# Patient Record
Sex: Female | Born: 1960 | Race: Black or African American | Hispanic: No
Health system: Midwestern US, Academic
[De-identification: ages and names within clinical notes are randomized; demographics above are authoritative.]

---

## 2018-08-09 ENCOUNTER — Emergency Department: Admit: 2018-08-09 | Discharge: 2018-08-09 | Payer: MEDICARE

## 2018-08-09 ENCOUNTER — Encounter: Admit: 2018-08-09 | Discharge: 2018-08-09 | Payer: MEDICARE | Primary: Sports Medicine

## 2018-08-09 ENCOUNTER — Emergency Department: Admit: 2018-08-09 | Discharge: 2018-08-10 | Disposition: A | Discharge status: Home / Self Care | Payer: MEDICARE

## 2018-08-09 DIAGNOSIS — R51 Headache: Secondary | ICD-10-CM

## 2018-08-09 DIAGNOSIS — R569 Unspecified convulsions: Principal | ICD-10-CM

## 2018-08-09 DIAGNOSIS — E119 Type 2 diabetes mellitus without complications: ICD-10-CM

## 2018-08-09 DIAGNOSIS — B20 Human immunodeficiency virus [HIV] disease: ICD-10-CM

## 2018-08-09 DIAGNOSIS — R079 Chest pain, unspecified: Secondary | ICD-10-CM

## 2018-08-09 MED ORDER — ACETAMINOPHEN 325 MG PO TAB
650 mg | Freq: Once | ORAL | 0 refills | Status: CP
Start: 2018-08-09 — End: ?
  Administered 2018-08-10: 03:00:00 650 mg via ORAL

## 2018-08-09 MED ORDER — SODIUM CHLORIDE 0.9 % IV SOLP
1000 mL | Freq: Once | INTRAVENOUS | 0 refills | Status: CP
Start: 2018-08-09 — End: ?
  Administered 2018-08-10: 08:00:00 1000 mL via INTRAVENOUS

## 2018-08-09 MED ORDER — SODIUM CHLORIDE 0.9 % IV SOLP
1000 mL | Freq: Once | INTRAVENOUS | 0 refills | Status: CP
Start: 2018-08-09 — End: ?
  Administered 2018-08-10: 04:00:00 1000 mL via INTRAVENOUS

## 2018-08-09 MED ORDER — DIPHENHYDRAMINE HCL 50 MG/ML IJ SOLN
25 mg | Freq: Once | INTRAVENOUS | 0 refills | Status: CP
Start: 2018-08-09 — End: ?
  Administered 2018-08-10: 04:00:00 25 mg via INTRAVENOUS

## 2018-08-09 MED ORDER — MAGNESIUM SULFATE IN D5W 1 GRAM/100 ML IV PGBK
1 g | Freq: Once | INTRAVENOUS | 0 refills | Status: CP
Start: 2018-08-09 — End: ?
  Administered 2018-08-10: 08:00:00 1 g via INTRAVENOUS

## 2018-08-09 MED ORDER — METOCLOPRAMIDE HCL 5 MG/ML IJ SOLN
10 mg | Freq: Once | INTRAVENOUS | 0 refills | Status: CP
Start: 2018-08-09 — End: ?
  Administered 2018-08-10: 04:00:00 10 mg via INTRAVENOUS

## 2018-08-10 ENCOUNTER — Emergency Department: Admit: 2018-08-09 | Discharge: 2018-08-09 | Payer: MEDICARE

## 2018-08-10 ENCOUNTER — Emergency Department: Admit: 2018-08-10 | Discharge: 2018-08-10 | Payer: MEDICARE

## 2018-08-10 ENCOUNTER — Encounter: Admit: 2018-08-10 | Discharge: 2018-08-10 | Payer: MEDICARE | Primary: Sports Medicine

## 2018-08-10 DIAGNOSIS — R202 Paresthesia of skin: ICD-10-CM

## 2018-08-10 DIAGNOSIS — R700 Elevated erythrocyte sedimentation rate: ICD-10-CM

## 2018-08-10 DIAGNOSIS — R51 Headache: ICD-10-CM

## 2018-08-10 DIAGNOSIS — R079 Chest pain, unspecified: ICD-10-CM

## 2018-08-10 DIAGNOSIS — G43009 Migraine without aura, not intractable, without status migrainosus: Principal | ICD-10-CM

## 2018-08-10 LAB — MAGNESIUM: Lab: 2.1 mg/dL (ref 1.6–2.6)

## 2018-08-10 LAB — CD4 ABSOLUTE CELL COUNT: Lab: 135 MMOL/L (ref 440–2160)

## 2018-08-10 LAB — URINALYSIS DIPSTICK
Lab: NEGATIVE
Lab: NEGATIVE 10*3/uL (ref 3–12)
Lab: NEGATIVE U/L (ref 7–56)
Lab: NEGATIVE mL/min — ABNORMAL LOW (ref 1.0–4.8)

## 2018-08-10 LAB — COMPREHENSIVE METABOLIC PANEL
Lab: 137 MMOL/L (ref 137–147)
Lab: 4.2 MMOL/L (ref 3.5–5.1)
Lab: 9.3 mg/dL (ref 8.5–10.6)

## 2018-08-10 LAB — URINALYSIS, MICROSCOPIC

## 2018-08-10 LAB — C REACTIVE PROTEIN (CRP): Lab: 1.8 mg/dL — ABNORMAL HIGH (ref <1.0)

## 2018-08-10 LAB — SED RATE: Lab: 45 mm/h — ABNORMAL HIGH (ref <1.60)

## 2018-08-10 LAB — POC CREATININE, RAD: Lab: 1.1 mg/dL — ABNORMAL HIGH (ref 0.4–1.00)

## 2018-08-10 LAB — POC TROPONIN

## 2018-08-10 LAB — CBC AND DIFF: Lab: 9 10*3/uL (ref 4.5–11.0)

## 2018-08-10 MED ORDER — VALPROIC ACID IVPB
500 mg | Freq: Once | INTRAVENOUS | 0 refills | Status: CP
Start: 2018-08-10 — End: ?
  Administered 2018-08-10 (×2): 500 mg via INTRAVENOUS

## 2018-08-11 LAB — CULTURE-URINE W/SENSITIVITY: Lab: 3

## 2018-08-12 LAB — HIV VIRAL LOAD PCR QUANT

## 2018-08-28 ENCOUNTER — Encounter: Admit: 2018-08-28 | Discharge: 2018-08-28 | Payer: MEDICARE | Primary: Sports Medicine

## 2018-08-28 DIAGNOSIS — B20 Human immunodeficiency virus [HIV] disease: ICD-10-CM

## 2018-08-28 DIAGNOSIS — R569 Unspecified convulsions: Principal | ICD-10-CM

## 2018-08-28 DIAGNOSIS — E119 Type 2 diabetes mellitus without complications: ICD-10-CM

## 2019-10-20 IMAGING — CT Head^_WITHOUT_CONTRAST (Adult)
1 series · 16 of 30 positions shown, 20 images · non-contrast
Comparison: none

[Series 2: brain w/o 4.8 brain · axial · non-contrast · 0.49mm/px · z∈[+112,+252]mm · 16 of 33 slices shown, 20 images]
[im 2/33  brain]
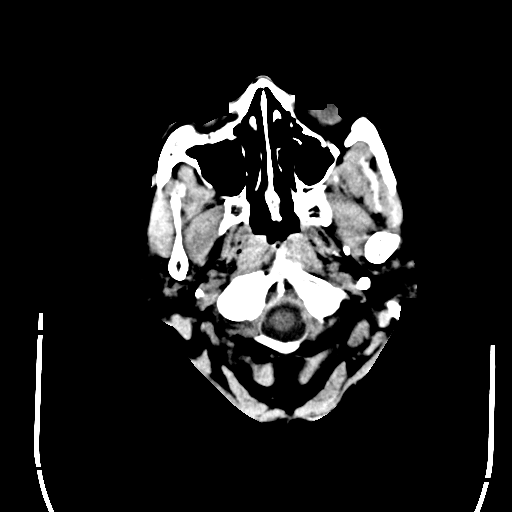
[im 2/33  bone]
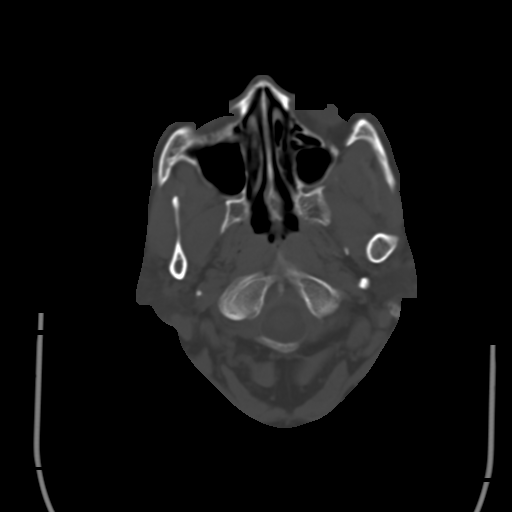
[im 4/33  brain]
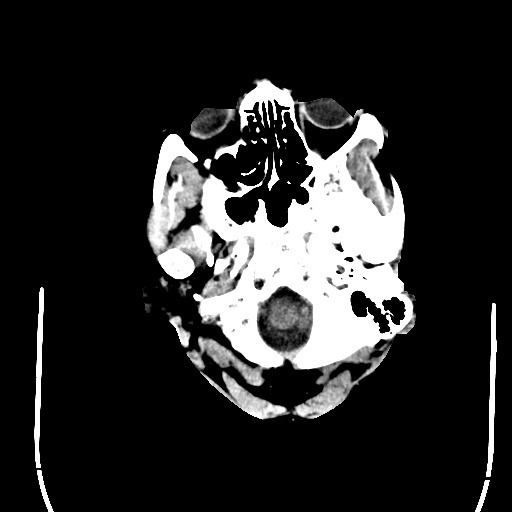
[im 6/33  brain]
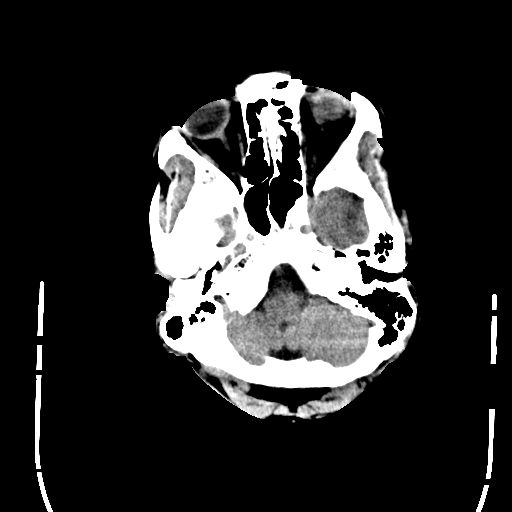
[im 8/33  brain]
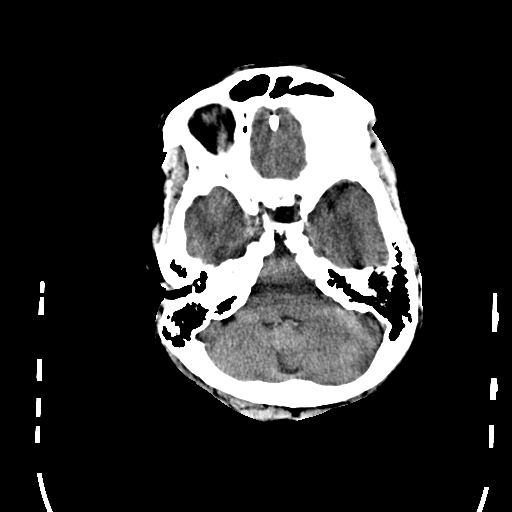
[im 9/33  brain]
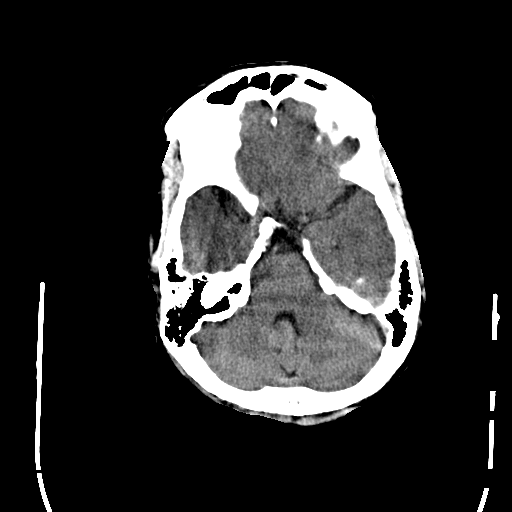
[im 9/33  bone]
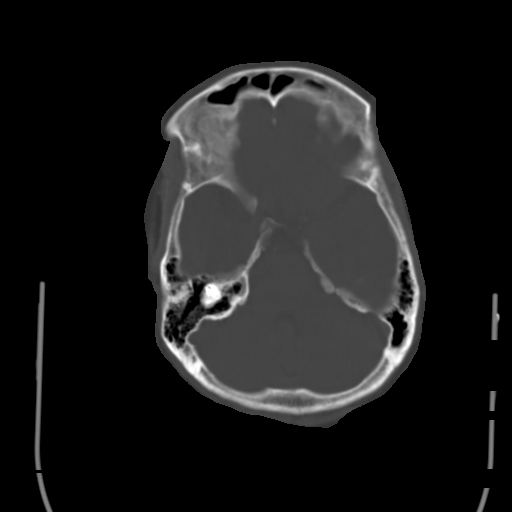
[im 12/33  brain]
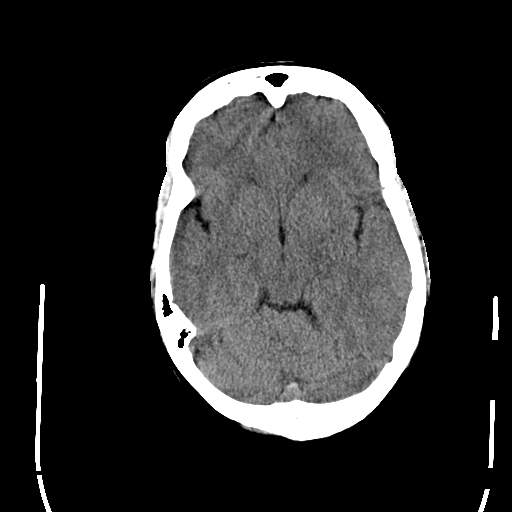
[im 14/33  brain]
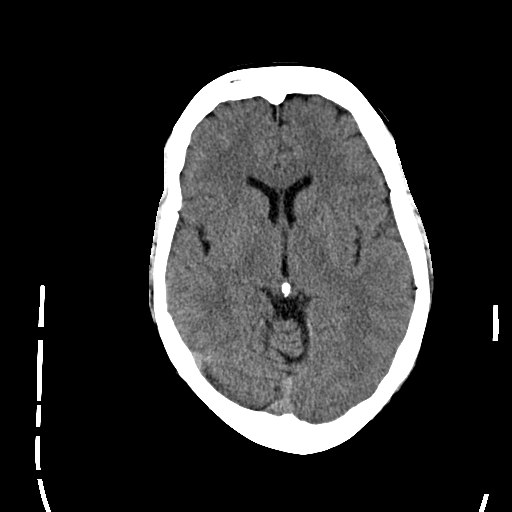
[im 16/33  brain]
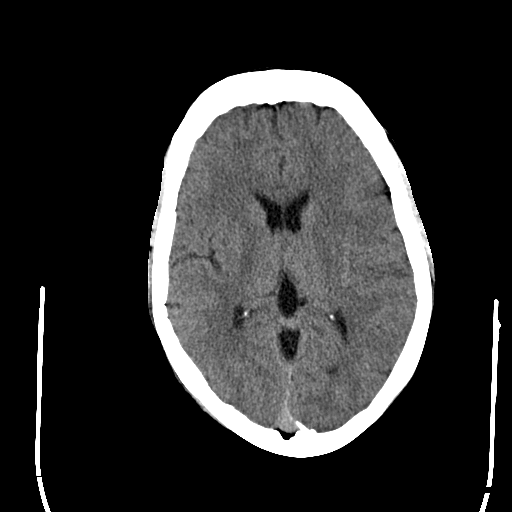
[im 17/33  brain]
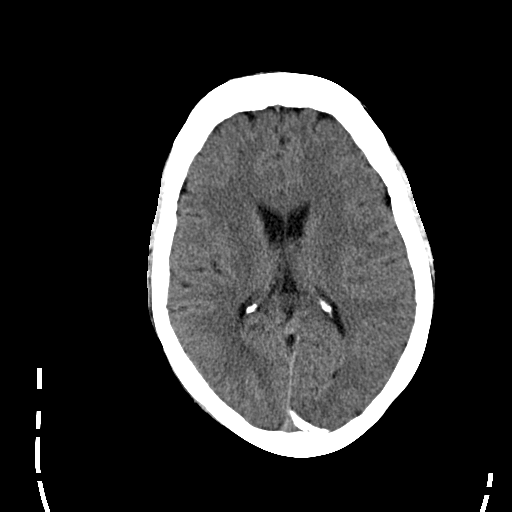
[im 17/33  bone]
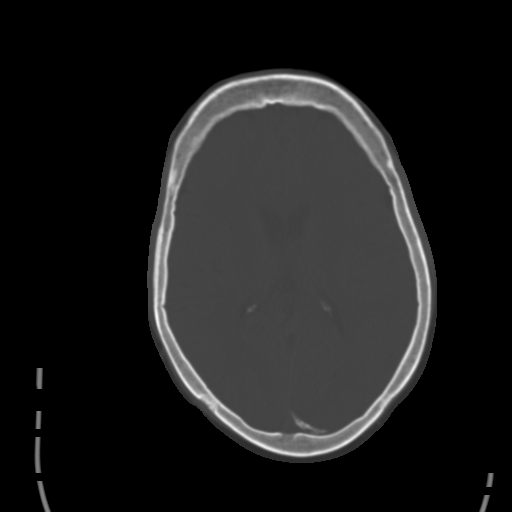
[im 19/33  brain]
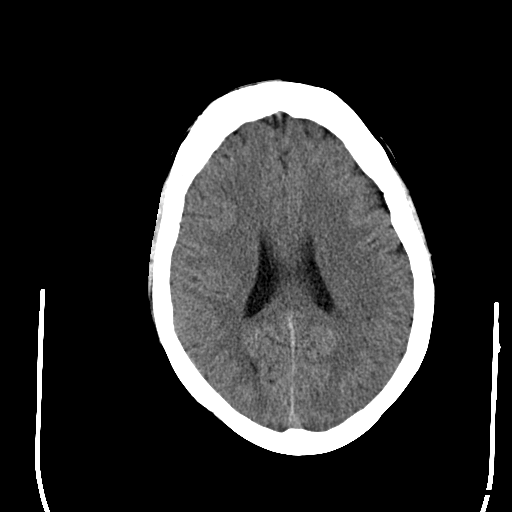
[im 21/33  brain]
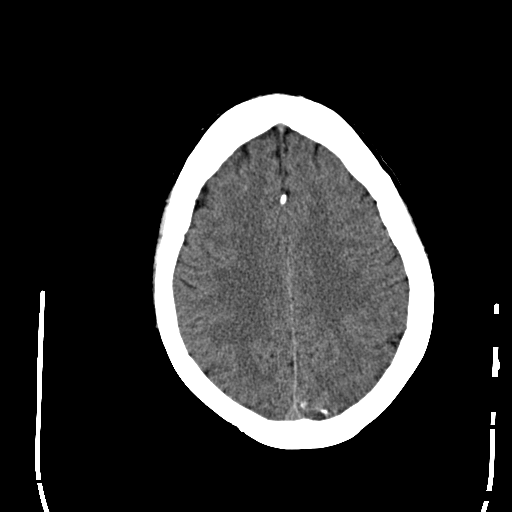
[im 24/33  brain]
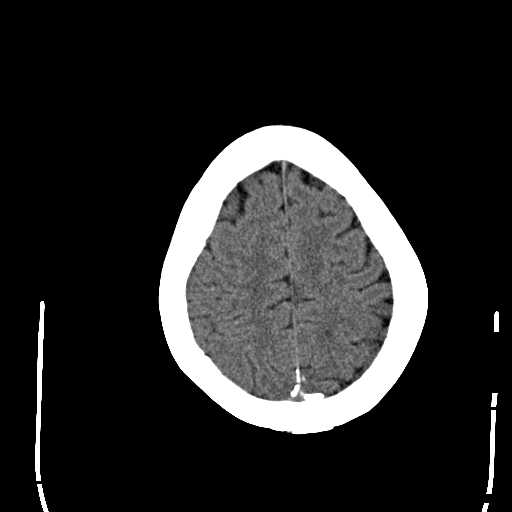
[im 25/33  brain]
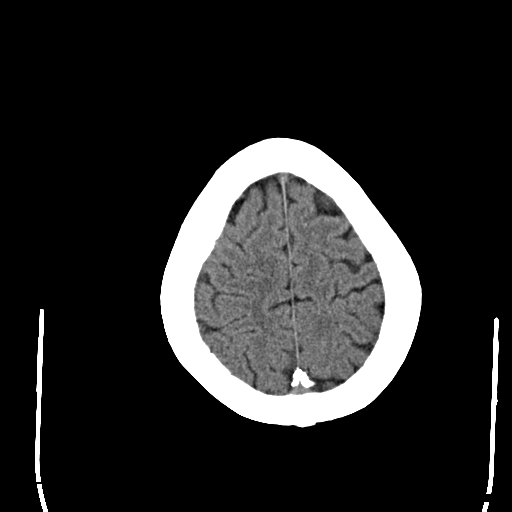
[im 25/33  bone]
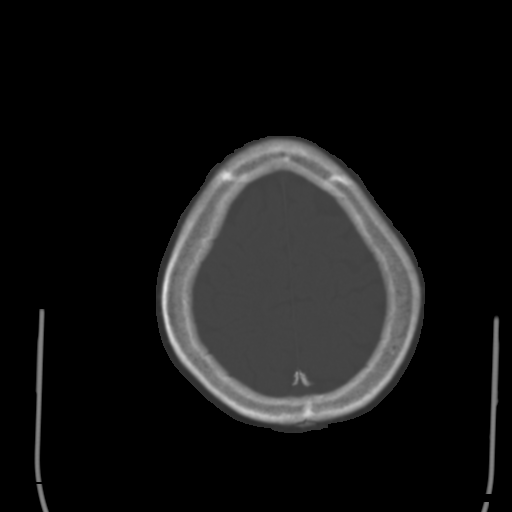
[im 27/33  brain]
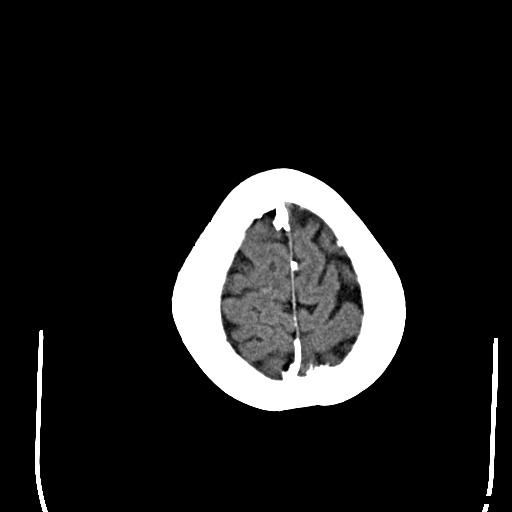
[im 29/33  brain]
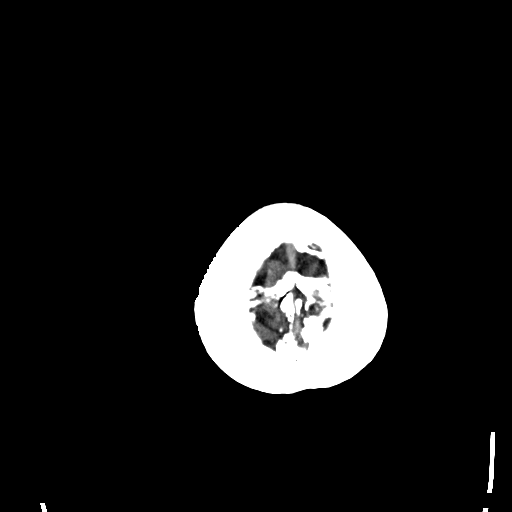
[im 31/33  brain]
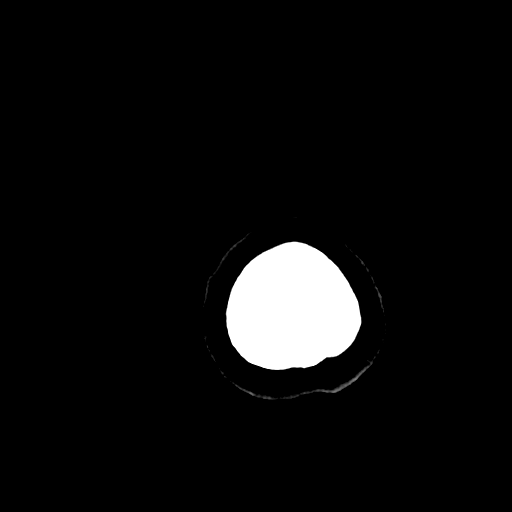

[16 of 30 positions shown; findings below may reference images not displayed]

EXAM
COMPUTED TOMOGRAPHY,HEAD OR BRAIN; WITHOUT CONTRAST MATERIAL; CPT 87327

INDICATION
headache HIV +
headache x3 days - AK/ME

TECHNIQUE
Noncontrast head CT was performed. All CT scans at this facility use dose modulation, iterative
reconstruction, and/or weight based dosing when appropriate to reduce radiation dose to as low as
reasonably achievable.

COMPARISONS
No prior studies are available for comparison.

FINDINGS
No evidence for acute infarct, mass, hemorrhage, or midline shift. No extra-axial fluid
collections. Gray/white matter differentiation is preserved. Ventricles appear normal.
Calcifications are appreciated throughout the dura and tracking along the falx.

Review of bone and soft tissue windows is unremarkable.

IMPRESSION
No acute intracranial abnormality.

Tech Notes:

headache x3 days - AK/ME

## 2020-02-21 ENCOUNTER — Emergency Department: Admit: 2020-02-21 | Discharge: 2020-02-21 | Payer: MEDICARE

## 2020-02-21 ENCOUNTER — Encounter: Admit: 2020-02-21 | Discharge: 2020-02-21 | Payer: MEDICARE | Primary: Sports Medicine

## 2020-02-21 DIAGNOSIS — R079 Chest pain, unspecified: Secondary | ICD-10-CM

## 2020-02-21 LAB — COMPREHENSIVE METABOLIC PANEL
Lab: 1 mg/dL — ABNORMAL HIGH (ref 0.4–1.00)
Lab: 1.4 mg/dL — ABNORMAL HIGH (ref 0.3–1.2)
Lab: 11 U/L (ref 7–56)
Lab: 139 MMOL/L (ref 137–147)
Lab: 14 U/L (ref 7–40)
Lab: 147 U/L — ABNORMAL HIGH (ref 25–110)
Lab: 16 mg/dL (ref 7–25)
Lab: 30 MMOL/L (ref 21–30)
Lab: 4.3 g/dL (ref 3.5–5.0)
Lab: 7 K/UL (ref 3–12)
Lab: 7.8 g/dL (ref 6.0–8.0)
Lab: 9.8 mg/dL (ref 8.5–10.6)

## 2020-02-21 LAB — CBC AND DIFF: Lab: 6.7 10*3/uL (ref 4.5–11.0)

## 2020-02-21 LAB — POC TROPONIN
Lab: 0 ng/mL (ref 0.00–0.05)
Lab: 0 ng/mL (ref 0.00–0.05)

## 2020-02-21 LAB — C REACTIVE PROTEIN (CRP): Lab: 0.8 mg/dL (ref <1.0)

## 2020-02-21 LAB — LIPASE: Lab: 4 U/L — ABNORMAL LOW (ref 11–82)

## 2020-02-21 LAB — CD4 ABSOLUTE CELL COUNT
Lab: 36 % (ref 28–63)
Lab: 753 (ref 440–2160)

## 2020-02-21 LAB — SED RATE: Lab: 52 mm/h — ABNORMAL HIGH (ref 0–30)

## 2020-02-21 MED ORDER — ACETAMINOPHEN/LIDOCAINE/ANTACID DS(#) 1:1:3  PO SUSP
30 mL | Freq: Once | ORAL | 0 refills | Status: CP
Start: 2020-02-21 — End: ?
  Administered 2020-02-21: 18:00:00 30 mL via ORAL

## 2020-02-21 MED ORDER — ACETAMINOPHEN 500 MG PO TAB
1000 mg | Freq: Once | ORAL | 0 refills | Status: CP
Start: 2020-02-21 — End: ?
  Administered 2020-02-21: 16:00:00 1000 mg via ORAL

## 2020-02-21 NOTE — ED Notes
Pt received discharge instructions verbalizes understanding with no questions at this time.  Pt ambulated out of department with steady gait. All belongings with pt at time of discharge. Pt provided cab pass home.

## 2020-02-21 NOTE — ED Notes
Mary Duke is a 59 y.o. female who presents to ED with CC of Chest Pain. Pt states she was at [facility] this morning after being evaluated at [facility] yesterday for similar complaint. Pt states at [facility] this morning they told her that she had a low BP and sent her to the ED. Pt endorses Chest Pain 9/10 in the center of chest, abdominal pain, HA. Pt denies N/V/D. Pt states she was supposed to get swabbed for COVID before going to [facility] but they didn't do it at [facility]. Pt denies other complaints. Pt placed on cardiac monitor, resting on cart in lowest and locked position with call light in reach. No needs identified at this time.    Medical History:   Diagnosis Date    DM (diabetes mellitus) (HCC)     HIV (human immunodeficiency virus infection) (HCC)     Seizure (HCC)        Belongings include: 1 blue gown, multiple bags with medications and clothes, 1 cell phone, 1 charger, 2 slides

## 2020-02-21 NOTE — ED Notes
Patient to X-ray via cart

## 2020-02-21 NOTE — ED Provider Notes
Mary Duke is a 59 y.o. female.    Chief Complaint:  Chief Complaint   Patient presents with   ? Chest Pain     seen at [facility] yesterday; picked up from [facility] today; denies psych complaints       History of Present Illness:  Ms. Mary Duke is a 59 year old female with PMH of DM, HIV, and seizure disorder that comes to the ER with a CC of midsternal chest pain that began yesterday afternoon.  Patient reports being transferred [facility] from hospital in [place] having thoughts about wanting to hurt people.  Denies these thoughts at this time; in addition she denies any current SI.  Ms. Mary Duke reports mid sternal chest pain that she rates as a 5/10, is aching in description, and is nonreproducible with palpation. She denies any aggravating factors or alleviating therapies, she denies radiation of pain to the neck, back, or arms.  She reports low-level shortness of breath at rest there is no increased with ambulation or movement.  She denies difficulty breathing.  She denies nausea vomiting, syncope, lightheadedness or dizziness, denies abdominal pain, difficulty with urination or bowel movements.  Reports a history of HIV with good medication compliance.           Review of Systems:  Review of Systems   Constitutional: Negative for activity change, appetite change, fatigue and fever.   HENT: Negative for ear pain and sinus pressure.    Eyes: Negative for photophobia, pain and visual disturbance.   Respiratory: Positive for shortness of breath. Negative for cough, chest tightness and wheezing.    Cardiovascular: Positive for chest pain. Negative for palpitations.   Gastrointestinal: Negative for abdominal pain, constipation, diarrhea, nausea and vomiting.   Endocrine: Negative.    Genitourinary: Negative for difficulty urinating, dysuria, frequency and urgency.   Musculoskeletal: Negative for back pain, gait problem, neck pain and neck stiffness.   Skin: Negative.  Negative for rash and wound.   Neurological: Negative for dizziness, weakness, light-headedness, numbness and headaches.   Hematological: Negative.    Psychiatric/Behavioral: Negative.        Allergies:  Asa [aspirin], Ultram [tramadol], and Levaquin [levofloxacin]    Past Medical History:  Medical History:   Diagnosis Date   ? DM (diabetes mellitus) (HCC)    ? HIV (human immunodeficiency virus infection) (HCC)    ? Seizure [facility])        Past Surgical History:  No past surgical history on file.    Pertinent medical/surgical history reviewed    Social History:  Social History     Tobacco Use   ? Smoking status: Never Smoker   ? Smokeless tobacco: Never Used   Substance Use Topics   ? Alcohol use: Not Currently   ? Drug use: Not Currently     Social History     Substance and Sexual Activity   Drug Use Not Currently       Family History:  No family history on file.    Vitals:  ED Vitals    Date and Time T BP P RR SPO2P SPO2 User   02/21/20 1358 -- -- 80 14 PER MINUTE 80 99 % RB   02/21/20 1339 -- -- 77 19 PER MINUTE 76 100 % RB   02/21/20 1336 -- -- -- 20 PER MINUTE 81 100 % RB   02/21/20 1330 -- 111/90 87 -- -- -- RB   02/21/20 1303 -- 116/85 76 -- -- -- RB   02/21/20 1200 --  103/75 72 14 PER MINUTE 72 99 % RB   02/21/20 1045 -- -- 73 14 PER MINUTE 73 100 % RB   02/21/20 1030 -- 117/85 77 16 PER MINUTE 78 100 % RB   02/21/20 0830 -- 120/94 -- -- 89 99 % RB   02/21/20 0715 36.7 ?C (98.1 ?F) 120/76 -- -- 76 98 % SL          Physical Exam:  Physical Exam  Constitutional:       Appearance: Normal appearance.   HENT:      Head: Normocephalic and atraumatic.      Right Ear: Tympanic membrane and ear canal normal.      Left Ear: Tympanic membrane and ear canal normal.      Nose: Nose normal.      Mouth/Throat:      Mouth: Mucous membranes are moist.   Eyes:      Pupils: Pupils are equal, round, and reactive to light.   Neck:      Musculoskeletal: Normal range of motion.   Cardiovascular:      Rate and Rhythm: Normal rate and regular rhythm.      Pulses: Normal pulses.      Heart sounds: Normal heart sounds.   Pulmonary:      Effort: Pulmonary effort is normal.      Breath sounds: Normal breath sounds.   Abdominal:      General: Abdomen is flat. Bowel sounds are normal.      Palpations: Abdomen is soft.   Musculoskeletal: Normal range of motion.   Skin:     General: Skin is warm.      Capillary Refill: Capillary refill takes less than 2 seconds.   Neurological:      General: No focal deficit present.      Mental Status: She is alert and oriented to person, place, and time.   Psychiatric:         Mood and Affect: Mood normal.         Behavior: Behavior normal.         Laboratory Results:  Labs Reviewed   COMPREHENSIVE METABOLIC PANEL - Abnormal       Result Value Ref Range Status    Sodium 139  137 - 147 MMOL/L Final    Potassium 3.8  3.5 - 5.1 MMOL/L Final    Chloride 102  98 - 110 MMOL/L Final    Glucose 102 (*) 70 - 100 MG/DL Final    Blood Urea Nitrogen 16  7 - 25 MG/DL Final    Creatinine 1.61 (*) 0.4 - 1.00 MG/DL Final    Calcium 9.8  8.5 - 10.6 MG/DL Final    Total Protein 7.8  6.0 - 8.0 G/DL Final    Total Bilirubin 1.4 (*) 0.3 - 1.2 MG/DL Final    Albumin 4.3  3.5 - 5.0 G/DL Final    Alk Phosphatase 147 (*) 25 - 110 U/L Final    AST (SGOT) 14  7 - 40 U/L Final    CO2 30  21 - 30 MMOL/L Final    ALT (SGPT) 11  7 - 56 U/L Final    Anion Gap 7  3 - 12 Final    eGFR Non African American 56 (*) >60 mL/min Final    eGFR African American >60  >60 mL/min Final   SED RATE - Abnormal    Sed Rate -ESR 52 (*) 0 - 30 MM/HR Final   LIPASE -  Abnormal    Lipase 4 (*) 11 - 82 U/L Final   CBC AND DIFF    Duffner Blood Cells 6.7  4.5 - 11.0 K/UL Final    RBC 4.55  4.0 - 5.0 M/UL Final    Hemoglobin 13.3  12.0 - 15.0 GM/DL Final    Hematocrit 16.1  36 - 45 % Final    MCV 89.4  80 - 100 FL Final    MCH 29.2  26 - 34 PG Final    MCHC 32.6  32.0 - 36.0 G/DL Final    RDW 09.6  11 - 15 % Final    Platelet Count 275  150 - 400 K/UL Final    MPV 7.8  7 - 11 FL Final    Neutrophils 62  41 - 77 % Final Lymphocytes 29  24 - 44 % Final    Monocytes 7  4 - 12 % Final    Eosinophils 1  0 - 5 % Final    Basophils 1  0 - 2 % Final    Absolute Neutrophil Count 4.18  1.8 - 7.0 K/UL Final    Absolute Lymph Count 1.98  1.0 - 4.8 K/UL Final    Absolute Monocyte Count 0.49  0 - 0.80 K/UL Final    Absolute Eosinophil Count 0.06  0 - 0.45 K/UL Final    Absolute Basophil Count 0.04  0 - 0.20 K/UL Final    MDW (Monocyte Distribution Width) 18.7  <20.7 Final   C REACTIVE PROTEIN (CRP)    C-Reactive Protein 0.84  <1.0 MG/DL Final   POC TROPONIN    Troponin-I-POC 0.00  0.00 - 0.05 NG/ML Final   POC TROPONIN    Troponin-I-POC 0.00  0.00 - 0.05 NG/ML Final   HIV VIRAL LOAD PCR QUANT   URINE TIGER TOP TUBE   URINE CLEAR TOP TUBE   EXTRA URINE GRAY TOP   POC TROPONIN   POC TROPONIN          Radiology Interpretation:    CHEST 2 VIEWS   Final Result         No acute cardiopulmonary abnormality.      By my electronic signature, I attest that I have personally reviewed the images for this examination and formulated the interpretations and opinions expressed in this report          Finalized by Judie Petit, M.D. on 02/21/2020 10:17 AM. Dictated by Dion Body, M.D. on 02/21/2020 9:58 AM.              EKG:    12 Lead EKG  Rate 82  Sinus rhythm  QTc 438  No Acute STEMI   Nonspecific T wave abnormalities    ED Course:    ED visit: Ms Diantonio comes to the ED from [facility] for CC of mid chest pain that has been present for the past 24hrs. Nonreproducible, low level SOB, no difficulty breathing, no nausea or vomiting.   Exam: Nonill presenting, denies current HI or SI, nonreproducible chest pain.   Workup: CBC, CMP, POC troponin, EKG, HIV viral load, CD4 count.   Treatments: Tylenol for pain-pain unresolved, GI cocktail given-patient reports improved pain,  Results: Elevation in ESR, CBC with WNL, slight elevation in total bilirubin, and creatinine -the abnormal findings of the total bilirubin and creatinine were discussed with the patient and she was informed that she needs to follow-up as an outpatient primary care, CXR WNL, troponin x2 - negative  Re-evaluation:  Reports resolved pain with GI cocktail -patient reports that she is able to tolerate p.o. intake -patient case discussed with PLS nursing and attending physician and patient okay to discharge to home strict return precautions  Dispo:     Discussion with the patient about going to [facility] versus returning to home-she denies any current SI or HI at this time and it was discussed with the [facility] service in the emergency department and has been cleared to discharge home safely.  Instructed to return to [facility] or emergency department if she developed new onset of SI or HI.   Patient reports that chest pain resolved with GI cocktail and she has been able to tolerate p.o. intake without increased discomfort  -Elevated T bili 1.4 and creatinine elevated at 1.02 discussed with the patient that she needs follow-up with her primary care physician -was discussed with the patient that this of unknown origin and that further evaluation is needed near future-she reported understanding of this and that she would schedule appointment with primary care and cardiology within the next week.    Strict return precautions were discussed with the patient including shortness of breath, chest pain, dizziness, syncope, nausea and vomiting, or abdominal pain -patient reported understanding of these return precautions           ED Scoring:  Total Score for Well's Criteria for PE: 0  Low Risk < 1.5 - Proceed to PERC  Intermediate Risk 2-6 - Proceed to D-Dimer  High Risk > 6.5 - Proceed to Imaging           HEART Total Score: 3  Low (0-3 points), with 0.9-1.7% risk over the next 6 weeks.  Moderate (4-7 points), with 12.0-16.6% risk over the next 6 weeks.  High (8-10 points), with 5--65% risk over the next 6 weeks.      MDM  Reviewed: previous chart  Reviewed previous: labs  Interpretation: labs, ECG and x-ray        Facility Administered Meds:  Medications   acetaminophen (TYLENOL) tablet 1,000 mg (1,000 mg Oral Given 02/21/20 1058)   acetaminophen/lidocaine/antacid DS(#) (GI COCKTAIL) 1:1:3 suspension 30 mL (30 mL Oral Given 02/21/20 1305)         Clinical Impression:  Clinical Impression   Chest pain, unspecified type       Disposition/Follow up  ED Disposition     ED Disposition    Discharge        Gershon Crane, DO  [address]  [place] [address]  [phone]      Please follow-up with your primary care within 1 week for elevated T bili and creatinine level    Cardiology: [facility]  [address]  [place], [id number]  [place] [address]  [phone]    Follow-up with cardiology with recurrence of chest pain and for continued monitoring      Medications:  There are no discharge medications for this patient.      Procedure Notes:  Procedures      Attestation / Supervision:  -----------      Philmore Pali, APRN-NP

## 2020-02-21 NOTE — ED Notes
Pt provided box lunch and water. [facility] dispatch and [facility] contacted to locate pt lost belongings of 1 suit case. [facility] does not have this belonging, and states it was not inventoried upon arrival to [facility]. Pt did not arrive with it to [facility].

## 2021-03-11 ENCOUNTER — Emergency Department: Admit: 2021-03-11 | Discharge: 2021-03-11 | Payer: MEDICARE

## 2021-03-11 ENCOUNTER — Encounter: Admit: 2021-03-11 | Discharge: 2021-03-11 | Payer: MEDICARE | Primary: Sports Medicine

## 2021-03-11 ENCOUNTER — Observation Stay: Admit: 2021-03-11 | Discharge: 2021-03-11 | Payer: MEDICARE | Primary: Sports Medicine

## 2021-03-11 DIAGNOSIS — R079 Chest pain, unspecified: Secondary | ICD-10-CM

## 2021-03-11 LAB — COMPREHENSIVE METABOLIC PANEL
Lab: 0.5 mg/dL (ref 0.3–1.2)
Lab: 1.1 mg/dL — ABNORMAL HIGH (ref 0.4–1.00)
Lab: 107 MMOL/L (ref 98–110)
Lab: 12 U/L (ref 7–40)
Lab: 141 MMOL/L — ABNORMAL LOW (ref 137–147)
Lab: 18 U/L — ABNORMAL HIGH (ref 7–56)
Lab: 27 MMOL/L (ref 21–30)
Lab: 27 mg/dL — ABNORMAL HIGH (ref 7–25)
Lab: 3.5 g/dL (ref 3.5–5.0)
Lab: 56 mL/min — ABNORMAL LOW (ref >60)
Lab: 6.4 g/dL (ref 6.0–8.0)
Lab: 66 mg/dL — ABNORMAL LOW (ref 70–100)
Lab: 7 K/UL (ref 3–12)
Lab: 81 U/L (ref 25–110)
Lab: 9 mg/dL (ref 8.5–10.6)

## 2021-03-11 LAB — CBC AND DIFF
Lab: 0 K/UL (ref 0–0.20)
Lab: 0 K/UL (ref 0–0.45)
Lab: 11 K/UL — ABNORMAL HIGH (ref 4.5–11.0)
Lab: 15 (ref <20.7)
Lab: 3.5 M/UL — ABNORMAL LOW (ref 4.0–5.0)

## 2021-03-11 LAB — MAGNESIUM: Lab: 1.9 mg/dL — ABNORMAL LOW (ref 1.6–2.6)

## 2021-03-11 LAB — IRON + BINDING CAPACITY + %SAT+ FERRITIN
Lab: 18 % — ABNORMAL LOW (ref 28–42)
Lab: 377 ug/dL (ref 270–380)
Lab: 48 ng/mL (ref 10–200)
Lab: 67 ug/dL (ref 50–160)

## 2021-03-11 LAB — POC GLUCOSE
Lab: 94 mg/dL (ref 70–100)
Lab: 79 mg/dL (ref 70–100)

## 2021-03-11 LAB — TROPONIN-I: Lab: 0 ng/mL (ref 0.0–0.05)

## 2021-03-11 LAB — POC TROPONIN: Lab: 0 ng/mL (ref 0.00–0.05)

## 2021-03-11 LAB — BNP POC ER: Lab: 207 pg/mL — ABNORMAL HIGH (ref 0–100)

## 2021-03-11 MED ORDER — SERTRALINE 100 MG PO TAB
100 mg | Freq: Every day | ORAL | 0 refills | Status: AC
Start: 2021-03-11 — End: ?
  Administered 2021-03-11 – 2021-03-12 (×2): 100 mg via ORAL

## 2021-03-11 MED ORDER — ONDANSETRON HCL (PF) 4 MG/2 ML IJ SOLN
4 mg | INTRAVENOUS | 0 refills | Status: AC | PRN
Start: 2021-03-11 — End: ?

## 2021-03-11 MED ORDER — ONDANSETRON HCL (PF) 4 MG/2 ML IJ SOLN
4 mg | Freq: Once | INTRAVENOUS | 0 refills | Status: CP
Start: 2021-03-11 — End: ?
  Administered 2021-03-11: 18:00:00 4 mg via INTRAVENOUS

## 2021-03-11 MED ORDER — ACETAMINOPHEN 325 MG PO TAB
650 mg | ORAL | 0 refills | Status: AC | PRN
Start: 2021-03-11 — End: ?
  Administered 2021-03-11 – 2021-03-12 (×4): 650 mg via ORAL

## 2021-03-11 MED ORDER — POLYETHYLENE GLYCOL 3350 17 GRAM PO PWPK
1 | Freq: Every day | ORAL | 0 refills | Status: AC | PRN
Start: 2021-03-11 — End: ?

## 2021-03-11 MED ORDER — PERFLUTREN LIPID MICROSPHERES 1.1 MG/ML IV SUSP
1-20 mL | Freq: Once | INTRAVENOUS | 0 refills | Status: CP | PRN
Start: 2021-03-11 — End: ?
  Administered 2021-03-11: 20:00:00 2 mL via INTRAVENOUS

## 2021-03-11 MED ORDER — MELATONIN 5 MG PO TAB
5 mg | Freq: Every evening | ORAL | 0 refills | Status: AC | PRN
Start: 2021-03-11 — End: ?
  Administered 2021-03-12: 02:00:00 5 mg via ORAL

## 2021-03-11 MED ORDER — LISINOPRIL 5 MG PO TAB
2.5 mg | Freq: Every day | ORAL | 0 refills | Status: AC
Start: 2021-03-11 — End: ?
  Administered 2021-03-12: 15:00:00 2.5 mg via ORAL

## 2021-03-11 MED ORDER — EMTRICITABINE/TENOFOVIR ALAFENAMIDE 200/25 MG TAB
Freq: Every day | ORAL | 0 refills | Status: AC
Start: 2021-03-11 — End: ?
  Administered 2021-03-11 – 2021-03-12 (×4): via ORAL

## 2021-03-11 MED ORDER — ONDANSETRON 4 MG PO TBDI
4 mg | ORAL | 0 refills | Status: AC | PRN
Start: 2021-03-11 — End: ?

## 2021-03-11 MED ORDER — LIDOCAINE 5 % TP PTMD
1 | Freq: Every day | TOPICAL | 0 refills | Status: AC
Start: 2021-03-11 — End: ?
  Administered 2021-03-11: 23:00:00 1 via TOPICAL

## 2021-03-11 MED ORDER — INSULIN ASPART 100 UNIT/ML SC FLEXPEN
0-6 [IU] | Freq: Before meals | SUBCUTANEOUS | 0 refills | Status: AC
Start: 2021-03-11 — End: ?

## 2021-03-11 MED ORDER — RITONAVIR 100 MG PO TAB
100 mg | Freq: Every day | ORAL | 0 refills | Status: AC
Start: 2021-03-11 — End: ?
  Administered 2021-03-11 – 2021-03-12 (×2): 100 mg via ORAL

## 2021-03-11 MED ORDER — ATAZANAVIR 150 MG PO CAP
300 mg | Freq: Every day | ORAL | 0 refills | Status: AC
Start: 2021-03-11 — End: ?
  Administered 2021-03-11 – 2021-03-12 (×2): 300 mg via ORAL

## 2021-03-11 MED ORDER — ATORVASTATIN 10 MG PO TAB
20 mg | Freq: Every day | ORAL | 0 refills | Status: AC
Start: 2021-03-11 — End: ?
  Administered 2021-03-11 – 2021-03-12 (×2): 20 mg via ORAL

## 2021-03-11 MED ORDER — TIZANIDINE 4 MG PO TAB
4 mg | ORAL | 0 refills | Status: AC | PRN
Start: 2021-03-11 — End: ?
  Administered 2021-03-11 – 2021-03-12 (×2): 4 mg via ORAL

## 2021-03-11 MED ORDER — LEVETIRACETAM 750 MG PO TAB
2250 mg | Freq: Two times a day (BID) | ORAL | 0 refills | Status: AC
Start: 2021-03-11 — End: ?
  Administered 2021-03-11 – 2021-03-12 (×3): 2250 mg via ORAL

## 2021-03-11 MED ORDER — FENTANYL CITRATE (PF) 50 MCG/ML IJ SOLN
50 ug | INTRAVENOUS | 0 refills | Status: CP | PRN
Start: 2021-03-11 — End: ?
  Administered 2021-03-11 (×2): 50 ug via INTRAVENOUS

## 2021-03-11 MED ORDER — SENNOSIDES-DOCUSATE SODIUM 8.6-50 MG PO TAB
1 | Freq: Every day | ORAL | 0 refills | Status: AC | PRN
Start: 2021-03-11 — End: ?

## 2021-03-11 MED ORDER — DOXYCYCLINE HYCLATE 100 MG PO TAB
100 mg | Freq: Two times a day (BID) | ORAL | 0 refills | Status: AC
Start: 2021-03-11 — End: ?
  Administered 2021-03-11 – 2021-03-12 (×3): 100 mg via ORAL

## 2021-03-11 MED ORDER — CLORAZEPATE DIPOTASSIUM 3.75 MG PO TAB
7.5 mg | Freq: Every evening | ORAL | 0 refills | Status: AC | PRN
Start: 2021-03-11 — End: ?

## 2021-03-11 MED ORDER — ENOXAPARIN 40 MG/0.4 ML SC SYRG
40 mg | Freq: Every day | SUBCUTANEOUS | 0 refills | Status: AC
Start: 2021-03-11 — End: ?
  Administered 2021-03-12: 03:00:00 40 mg via SUBCUTANEOUS

## 2021-03-11 NOTE — ED Provider Notes
Mary Duke is a 60 y.o. female.    Chief Complaint:  Chief Complaint   Patient presents with   ? Chest Pain     constant chest pain x4 months. Nausea starting today. Pt denies vomiting.       History of Present Illness:  Mary Duke is a 60 year old female with a past medical history of DM, HTN, HLD, seizures who presents with a 79-month history of constant constant, substernal chest tightness.  She was recently seen yesterday at an outside ER but was ultimately discharged.  She continues to have intolerable pain.  She reports new onset nausea today that she has not had in the past.  She is reporting slight shortness of breath.  Denies abdominal pain, vomiting, diarrhea.          Review of Systems:  Review of Systems   Constitutional: Negative for fever.   HENT: Negative for congestion.    Eyes: Negative for redness.   Respiratory: Positive for shortness of breath.    Cardiovascular: Positive for chest pain.   Gastrointestinal: Positive for nausea. Negative for abdominal pain.   Genitourinary: Negative for flank pain.   Musculoskeletal: Negative for arthralgias.   Skin: Negative for rash.   Neurological: Negative for headaches.       Allergies:  Asa [aspirin], Ultram [tramadol], and Levaquin [levofloxacin]    Past Medical History:  Medical History:   Diagnosis Date   ? DM (diabetes mellitus) (HCC)    ? HIV (human immunodeficiency virus infection) (HCC)    ? Seizure [facility])        Past Surgical History:  No past surgical history on file.    Pertinent medical and surgical history reviewed    Social History:  Social History     Tobacco Use   ? Smoking status: Never Smoker   ? Smokeless tobacco: Never Used   Substance Use Topics   ? Alcohol use: Not Currently   ? Drug use: Not Currently     Social History     Substance and Sexual Activity   Drug Use Not Currently             Family History:  No family history on file.    Vitals:  ED Vitals    Date and Time T BP P RR SPO2P SPO2 User   03/11/21 1200 -- 134/86 77 10 PER MINUTE 80 100 % SF   03/11/21 1144 36.5 ?C (97.7 ?F) 133/96 74 14 PER MINUTE -- 99 % SF          Physical Exam:  Physical Exam  Vitals and nursing note reviewed.   Constitutional:       Appearance: Normal appearance. She is normal weight.   HENT:      Head: Normocephalic and atraumatic.   Eyes:      Extraocular Movements: Extraocular movements intact.      Conjunctiva/sclera: Conjunctivae normal.   Cardiovascular:      Rate and Rhythm: Normal rate and regular rhythm.   Pulmonary:      Effort: Pulmonary effort is normal. No respiratory distress.      Breath sounds: Normal breath sounds.   Abdominal:      General: There is no distension.      Palpations: Abdomen is soft.      Tenderness: There is no abdominal tenderness.   Musculoskeletal:         General: Normal range of motion.      Cervical back: Neck supple.  Neurological:      Mental Status: She is oriented to person, place, and time. Mental status is at baseline.   Psychiatric:         Mood and Affect: Mood normal.         Behavior: Behavior normal.         Laboratory Results:  Labs Reviewed   CBC AND DIFF - Abnormal       Result Value Ref Range Status    Haan Blood Cells 11.9 (*) 4.5 - 11.0 K/UL Final    RBC 3.58 (*) 4.0 - 5.0 M/UL Final    Hemoglobin 10.5 (*) 12.0 - 15.0 GM/DL Final    Hematocrit 16.1 (*) 36 - 45 % Final    MCV 93.5  80 - 100 FL Final    MCH 29.4  26 - 34 PG Final    MCHC 31.4 (*) 32.0 - 36.0 G/DL Final    RDW 09.6  11 - 15 % Final    Platelet Count 251  150 - 400 K/UL Final    MPV 7.8  7 - 11 FL Final    Neutrophils 66  41 - 77 % Final    Lymphocytes 27  24 - 44 % Final    Monocytes 6  4 - 12 % Final    Eosinophils 0  0 - 5 % Final    Basophils 1  0 - 2 % Final    Absolute Neutrophil Count 7.97 (*) 1.8 - 7.0 K/UL Final    Absolute Lymph Count 3.15  1.0 - 4.8 K/UL Final    Absolute Monocyte Count 0.69  0 - 0.80 K/UL Final    Absolute Eosinophil Count 0.01  0 - 0.45 K/UL Final    Absolute Basophil Count 0.07  0 - 0.20 K/UL Final    MDW (Monocyte Distribution Width) 15.6  <20.7 Final   COMPREHENSIVE METABOLIC PANEL - Abnormal    Sodium 141  137 - 147 MMOL/L Final    Potassium 3.7  3.5 - 5.1 MMOL/L Final    Chloride 107  98 - 110 MMOL/L Final    Glucose 66 (*) 70 - 100 MG/DL Final    Blood Urea Nitrogen 27 (*) 7 - 25 MG/DL Final    Creatinine 0.45 (*) 0.4 - 1.00 MG/DL Final    Calcium 9.0  8.5 - 10.6 MG/DL Final    Total Protein 6.4  6.0 - 8.0 G/DL Final    Total Bilirubin 0.5  0.3 - 1.2 MG/DL Final    Albumin 3.5  3.5 - 5.0 G/DL Final    Alk Phosphatase 81  25 - 110 U/L Final    AST (SGOT) 12  7 - 40 U/L Final    CO2 27  21 - 30 MMOL/L Final    ALT (SGPT) 18  7 - 56 U/L Final    Anion Gap 7  3 - 12 Final    eGFR 56 (*) >60 mL/min Final   BNP POC ER - Abnormal    BNP POC 207.0 (*) 0 - 100 PG/ML Final   MAGNESIUM    Magnesium 1.9  1.6 - 2.6 mg/dL Final   POC TROPONIN    Troponin-I-POC 0.01  0.00 - 0.05 NG/ML Final   IRON + BINDING CAPACITY + %SAT+ FERRITIN   POC TROPONIN   POC BNP   POC GLUCOSE   POC GLUCOSE          Radiology Interpretation:    CHEST SINGLE  VIEW   Final Result         Mild left basilar atelectasis and/or scarring.          Finalized by Golda Acre, M.D. on 03/11/2021 1:50 PM. Dictated by Golda Acre, M.D. on 03/11/2021 1:49 PM.         2D + DOPPLER ECHO    (Results Pending)         EKG:    12 lead EKG shows a rate of 78  PR interval, QRS duration, QT interval within normal limits.   No ST segment elevations or depressions.    Interpretation: normal sinus rhythm.      ED Course:  Patient is a 60 y.o. female with a past medical history of hypertension, hyperlipidemia, diabetes who presented to the ED for chest pain.    - Initial vitals were significant for slight hypertension of 133/96.  - See physical exam as noted above  - Differential diagnosis includes but is not limited to ACS, MI, musculoskeletal chest pain, GERD    - EKG was obtained and showed normal sinus rhythm  - Labs were obtained and showed CBC with anemia of 10.5.  Mild leukocytosis of 11.9.  CMP with no major electrolyte abnormalities.  No AKI.  Troponin was 0.01.  BNP slightly elevated at 207.  - Imaging was obtained and showed mild left basilar atelectasis    ED Course:  Patient was evaluated for chest pain.  EKG showed no STEMI.  Troponin negative.  Laboratory studies were obtained as above.  Chest x-ray was significant for mild basilar atelectasis. Pain was controlled with fentanyl.  Nausea was controlled with Zofran.  Given patient's risk factors for atherosclerotic disease and the patient's heart score, decision was made to admit patient for observation and possible stress test.  Discussed this with internal medicine team, who agreed to admit patient for further management.      - Findings discussed with patient and the plan for admission. Patient understands and agrees with plan. Patient reassessed with appropriate vital signs and symptom control for admission. Patient verbalized understanding. All questions were answered prior to admission.     -Dispo: Admit to medicine       ED Scoring:                    HEART Total Score: 4  Low (0-3 points), with 0.9-1.7% risk over the next 6 weeks.  Moderate (4-7 points), with 12.0-16.6% risk over the next 6 weeks.  High (8-10 points), with 5--65% risk over the next 6 weeks.         Coding    Facility Administered Meds:  Medications   fentaNYL citrate PF (SUBLIMAZE) injection 50 mcg (50 mcg Intravenous Given 03/11/21 1252)   senna/docusate (SENOKOT-S) tablet 1 tablet (has no administration in time range)   polyethylene glycol 3350 (MIRALAX) packet 17 g (has no administration in time range)   ondansetron (ZOFRAN ODT) rapid dissolve tablet 4 mg (has no administration in time range)     Or   ondansetron (ZOFRAN) injection 4 mg (has no administration in time range)   melatonin tablet 5 mg (has no administration in time range)   acetaminophen (TYLENOL) tablet 650 mg (has no administration in time range)   atorvastatin (LIPITOR) tablet 20 mg (has no administration in time range)   lisinopriL (ZESTRIL) tablet 2.5 mg (has no administration in time range)   insulin aspart (U-100) (NOVOLOG FLEXPEN U-100 INSULIN) injection PEN 0-6 Units (has no  administration in time range)   levETIRAcetam (KEPPRA) tablet 2,250 mg (has no administration in time range)   doxycycline hyclate (VIBRACIN) tablet 100 mg (has no administration in time range)   emtricitabine/tenofovir ALAFENAMIDE fumarate 200/25 mg tab/cap combination (has no administration in time range)   atazanavir (REYATAZ) capsule 300 mg (has no administration in time range)   ritonavir (NORVIR) tablet 100 mg (has no administration in time range)   sertraline (ZOLOFT) tablet 50 mg (has no administration in time range)   clorazepate dipotassium (TRANXENE) tablet 7.5 mg (has no administration in time range)   tiZANidine (ZANAFLEX) tablet 4 mg (has no administration in time range)   perflutren lipid microspheres (DEFINITY) injection 1-20 Diluted mL (has no administration in time range)   ondansetron (ZOFRAN) injection 4 mg (4 mg Intravenous Given 03/11/21 1252)         Clinical Impression:  Clinical Impression   Chest pain, unspecified type       Disposition/Follow up  ED Disposition     ED Disposition    Admit        No follow-up provider specified.    Medications:  New Prescriptions    No medications on file       Procedure Notes:  Procedures      Attestation / Supervision:      Darlyn Chamber, MD  Emergency Medicine PGY-1

## 2021-03-11 NOTE — ED Notes
Pt endorsing pain to IV site and had redness/swelling around site. IV therapy order placed for assessment of site and possibly new line.

## 2021-03-11 NOTE — ED Notes
60 YO F presents to ED c/c chest pain. Pt reports the chest pain has been constant for 4 months, and she has been previously for same. Pt also endorses nausea that began in route to the ER. Pt denies any vomiting. Pt denies any other complaints at this time. Pt placed on continuous cardiac monitoring. Pt A&OX4, breathing unlabored. Bed locked in low position. Call light within reach.    Belongings:  Civil engineer, contracting

## 2021-03-11 NOTE — H&P (View-Only)
Admission History and Physical Examination      Name:  Mary Duke                                             MRN:  [id number]   Admission Date:  03/11/2021                     Assessment/Plan:    Principal Problem:    Chest pain    Mary Duke is a 60 y.o. female with PMHx of HTN, DM II, HLD, HIV, chronic LBP, recurrent chest pain who presents to the ED for chest pain evaluation.    Chest pain  - Looking back , long history of chest pain that has been ongoing for years.  - Had a TTE 2018 showing normal LVEF, no other abnormalities.  - Reports recurrent, persistent, pleuritic chest pain over the last 4 months.  - Worsening over the last few days, seen in multiple outside EDs lastly yesterday, discharged from the ED after being given fentanyl.  - Here with persistent pain that she reports is intolerable, doesn't appear uncomfortable during interview.  - VS WNL, EKG NSR with no ischemic changes, initial troponin negative.  - CXR with no acute abnormality.  - Given IV fentanyl 50 mcg x2 in the ED  Plan:  - Monitor on telemetry  - Serial troponins and EKGs  - Obtain repeat TTE  - NPO pMN for stress test 4/16  - Pain control with Tylenol, lidocaine patches, tizanidine PRN, attempt to avoid narcotics.    DM II  - Noted low glucose levels on presentation  - Hold PTA oral medications, LDCF while inpatient  - Continue PTA lisinopril 2.5 mg daily    HLD  - Continue PTA atorvastatin 20 mg daily    Seizure disorder  - Continue PTA Keppra    HIV  - PTA on Reyataz 300 mg daily, Descovy 200-25 mg daily, and Norvir 100 mg daily.  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    Depression, anxiety  - Continue PTA sertraline 50 mg daily  - Continue PTA clorazepate PRN    FEN: No IVF, Caffeine free diet, NPO pMN, monitor lytes and replenish  Prophylaxis: Lovenox  Code status: Full Code    >70 minutes spent in reviewing the patient's chart (prior admits, lab results, imaging if available), interviewing/examining the patient and formulating plan of care.    Sanjuana Letters, MD  [facility] [phone]  __________________________________________________________________________________  Primary Care Physician: Gershon Crane  Verified    Chief Complaint:  Chest pain    History of Present Illness: Mary Duke is a 60 y.o. female with PMHx of HTN, DM II, HLD, chronic LBP, recurrent chest pain who presents to the ED for chest pain evaluation.    Patient reports a 66-month history of constant, substernal chest tightness. She was recently seen yesterday at an outside ER but was ultimately discharged. She continues to have intolerable pain. She reports new onset nausea today that she has not had in the past. She is reporting slight shortness of breath. Denies abdominal pain, vomiting, diarrhea. She couldn't recall when was the last time she has had a stress test.    Medical History:   Diagnosis Date   ?  DM (diabetes mellitus) (HCC)    ? HIV (human immunodeficiency virus infection) (HCC)    ? Seizure (HCC)      No past surgical history on file.  No family history on file.  Social History     Socioeconomic History   ? Marital status: Widowed     Spouse name: Not on file   ? Number of children: Not on file   ? Years of education: Not on file   ? Highest education level: Not on file   Occupational History   ? Not on file   Tobacco Use   ? Smoking status: Never Smoker   ? Smokeless tobacco: Never Used   Substance and Sexual Activity   ? Alcohol use: Not Currently   ? Drug use: Not Currently   ? Sexual activity: Not on file   Other Topics Concern   ? Not on file   Social History Narrative   ? Not on file     Social Determinants of Health     Financial Resource Strain: Not on file   Food Insecurity: Not on file   Transportation Needs: Not on file   Physical Activity: Not on file   Stress: Not on file   Social Connections: Not on file   Intimate Partner Violence: Not on file   Housing Stability: Not on file                    Immunizations (includes history and patient reported):   Immunization History   Administered Date(s) Administered   ? COVID-19 (PFIZER), mRNA vacc, 30 mcg/0.3 mL (PF) 03/17/2020, 04/07/2020           Allergies:  Asa [aspirin], Ultram [tramadol], and Levaquin [levofloxacin]    Medications:  Current Facility-Administered Medications   Medication   ? fentaNYL citrate PF (SUBLIMAZE) injection 50 mcg     No current outpatient medications on file.     Review of Systems:  A 14 point review of systems was negative except for: Respiratory: positive for increased work of breathing  Cardiovascular: positive for chest pain, chest pressure/discomfort, dyspnea  Gastrointestinal: positive for nausea    Physical Exam:  Vital Signs: Last Filed In 24 Hours Vital Signs: 24 Hour Range   BP: 134/86 (04/15 1200)  Temp: 36.5 ?C (97.7 ?F) (04/15 1144)  Pulse: 77 (04/15 1200)  Respirations: 10 PER MINUTE (04/15 1200)  SpO2: 100 % (04/15 1200)  O2 Device: None (Room air) (04/15 1144)  SpO2 Pulse: 80 (04/15 1200)  Height: 167.6 cm (5' 6) (04/15 1144) BP: (133-134)/(86-96)   Temp:  [36.5 ?C (97.7 ?F)]   Pulse:  [74-77]   Respirations:  [10 PER MINUTE-14 PER MINUTE]   SpO2:  [99 %-100 %]   O2 Device: None (Room air)   Intensity Pain Scale (Self Report): 10 (03/11/21 1144)      General:  Alert, cooperative, no distress, appears stated age  Head:  Normocephalic, without obvious abnormality, atraumatic  Eyes:  Conjunctivae/corneas clear.  PERRL, EOMs intact.  Ears: Normal TMs and external ear canals, both ears  Nose: Nares normal.  Septum midline. Mucosa normal.  No drainageor sinus tenderness  Throat: Lips, mucosa and tongue normal.  Teeth and gums normal  Neck:    Supple, symmetrical, trachea midline, no adenopathy, thyroid: no enlargement/tenderness/nodules, no carotid bruit and no JVD  Back:  Symmetric, no curvature, ROM normal.  No CVA tenderness.  Lungs:  Clear to auscultation bilaterally  Chest wall:  No  tenderness or deformity.  Heart:   Regular rate and rhythm, S1, S2 normal, no murmur, click rub or gallop  Abdomen:  Soft, non-tender.  Bowel sounds normal.  No masses.  No organomegaly.  Extremities: Extremities normal, atraumatic, no cyanosis or edema  Peripheral pulses   2+ and symmetric, all extremities  Skin: Skin color, texture, turgor normal.  No rashes or lesions  Lymph nodes:  Cervical, supraclavicular and axillary nodes normal  Neurologic:   CNII - XII intact.  Normal strength, sensation and reflexes throughout.  Musculoskeletal:   Normal / Negative  Psych: Appropriate      Lab/Radiology/Other Diagnostic Tests:  Pertinent labs reviewed  Glucose: (!) 66 (03/11/21 1226)  Pertinent radiology reviewed., EKG Reviewed    Sanjuana Letters, MD

## 2021-03-12 ENCOUNTER — Encounter: Admit: 2021-03-12 | Discharge: 2021-03-12 | Payer: MEDICARE | Primary: Sports Medicine

## 2021-03-12 ENCOUNTER — Observation Stay: Admit: 2021-03-12 | Discharge: 2021-03-12 | Payer: MEDICARE | Primary: Sports Medicine

## 2021-03-12 MED ADMIN — DEXTROSE 50 % IN WATER (D50W) IV SOLP [86270]: 25 mL | INTRAVENOUS | @ 13:00:00 | Stop: 2021-03-12 | NDC 00409664816

## 2021-03-12 MED ADMIN — REGADENOSON 0.4 MG/5 ML IV SYRG [168865]: 0.4 mg | INTRAVENOUS | @ 15:00:00 | Stop: 2021-03-12 | NDC 00469650189

## 2021-03-12 MED ADMIN — RP DX TL-201 THALLOUS CHL MCI [210517]: 2.7 | INTRAVENOUS | @ 15:00:00 | Stop: 2021-03-12 | NDC 54029258509

## 2021-03-12 MED ADMIN — RP DX TL-201 THALLOUS CHL MCI [210517]: 0.53 | INTRAVENOUS | @ 17:00:00 | Stop: 2021-03-12 | NDC 54029258509

## 2021-03-12 MED FILL — TIZANIDINE 4 MG PO TAB: 4 mg | ORAL | 10 days supply | Qty: 30 | Fill #1 | Status: CP

## 2022-03-10 ENCOUNTER — Encounter: Admit: 2022-03-10 | Discharge: 2022-03-10 | Payer: MEDICARE | Primary: Sports Medicine

## 2022-03-10 ENCOUNTER — Ambulatory Visit: Admit: 2022-03-10 | Discharge: 2022-03-10 | Payer: MEDICARE | Primary: Sports Medicine

## 2022-03-10 DIAGNOSIS — R569 Unspecified convulsions: Secondary | ICD-10-CM

## 2022-03-10 DIAGNOSIS — R079 Chest pain, unspecified: Secondary | ICD-10-CM

## 2022-03-10 DIAGNOSIS — R778 Other specified abnormalities of plasma proteins: Secondary | ICD-10-CM

## 2022-04-25 ENCOUNTER — Encounter: Admit: 2022-04-25 | Discharge: 2022-04-25 | Payer: MEDICARE | Primary: Sports Medicine

## 2023-07-15 ENCOUNTER — Encounter: Admit: 2023-07-15 | Discharge: 2023-07-15 | Payer: MEDICARE | Primary: Sports Medicine

## 2023-07-19 ENCOUNTER — Encounter: Admit: 2023-07-19 | Discharge: 2023-07-19 | Payer: MEDICARE | Primary: Sports Medicine

## 2024-04-05 IMAGING — CR [ID]
3 series · 3 of 3 positions shown · non-contrast
Comparison: none

[knee ap]
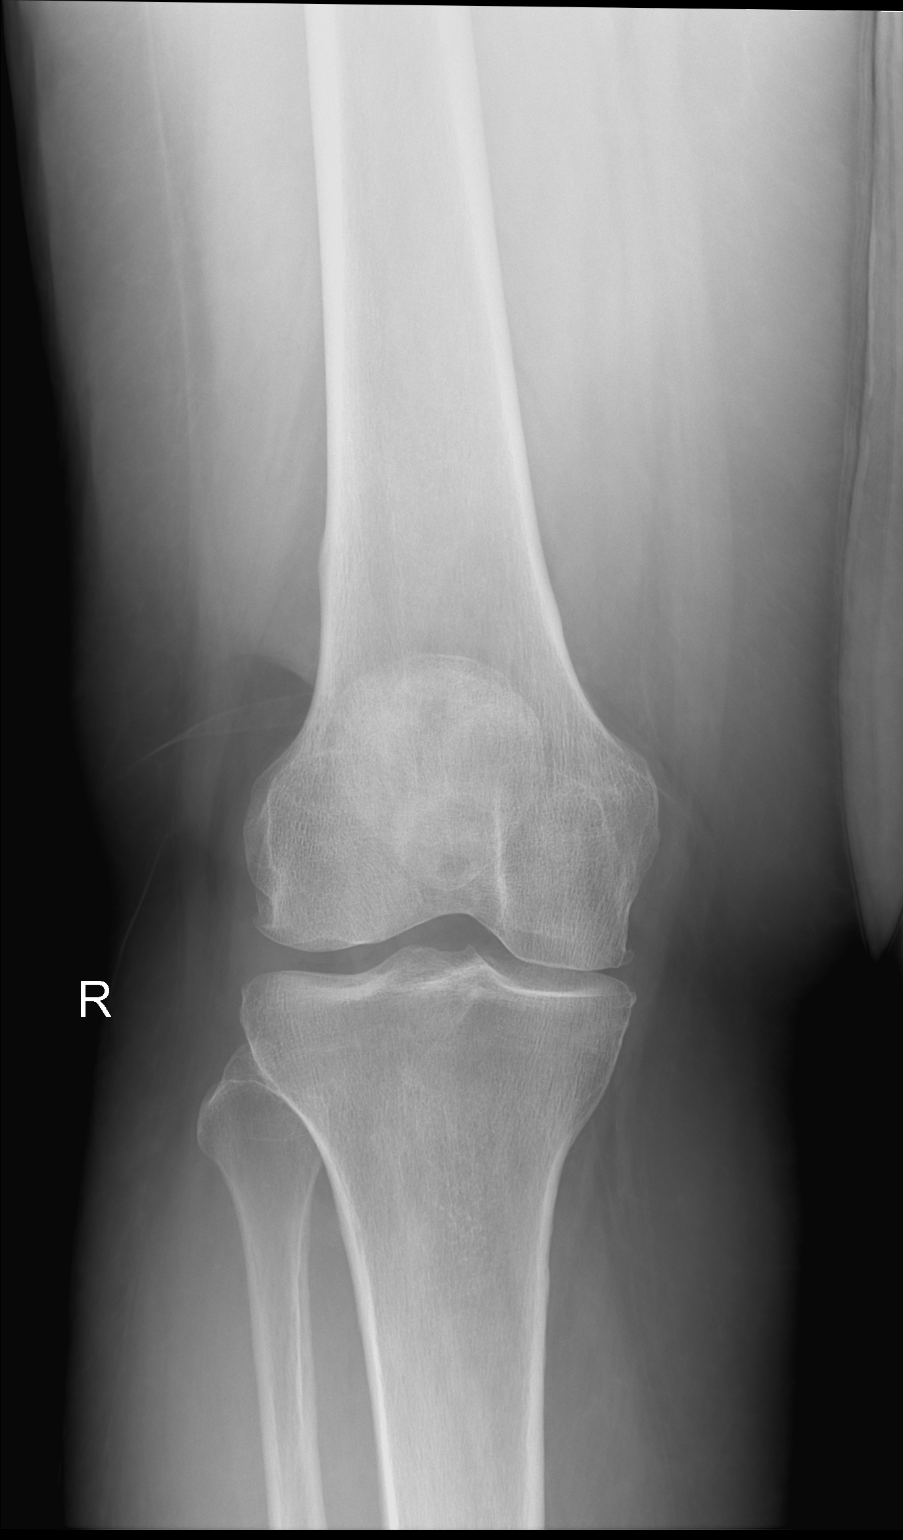

[knee sunrise]
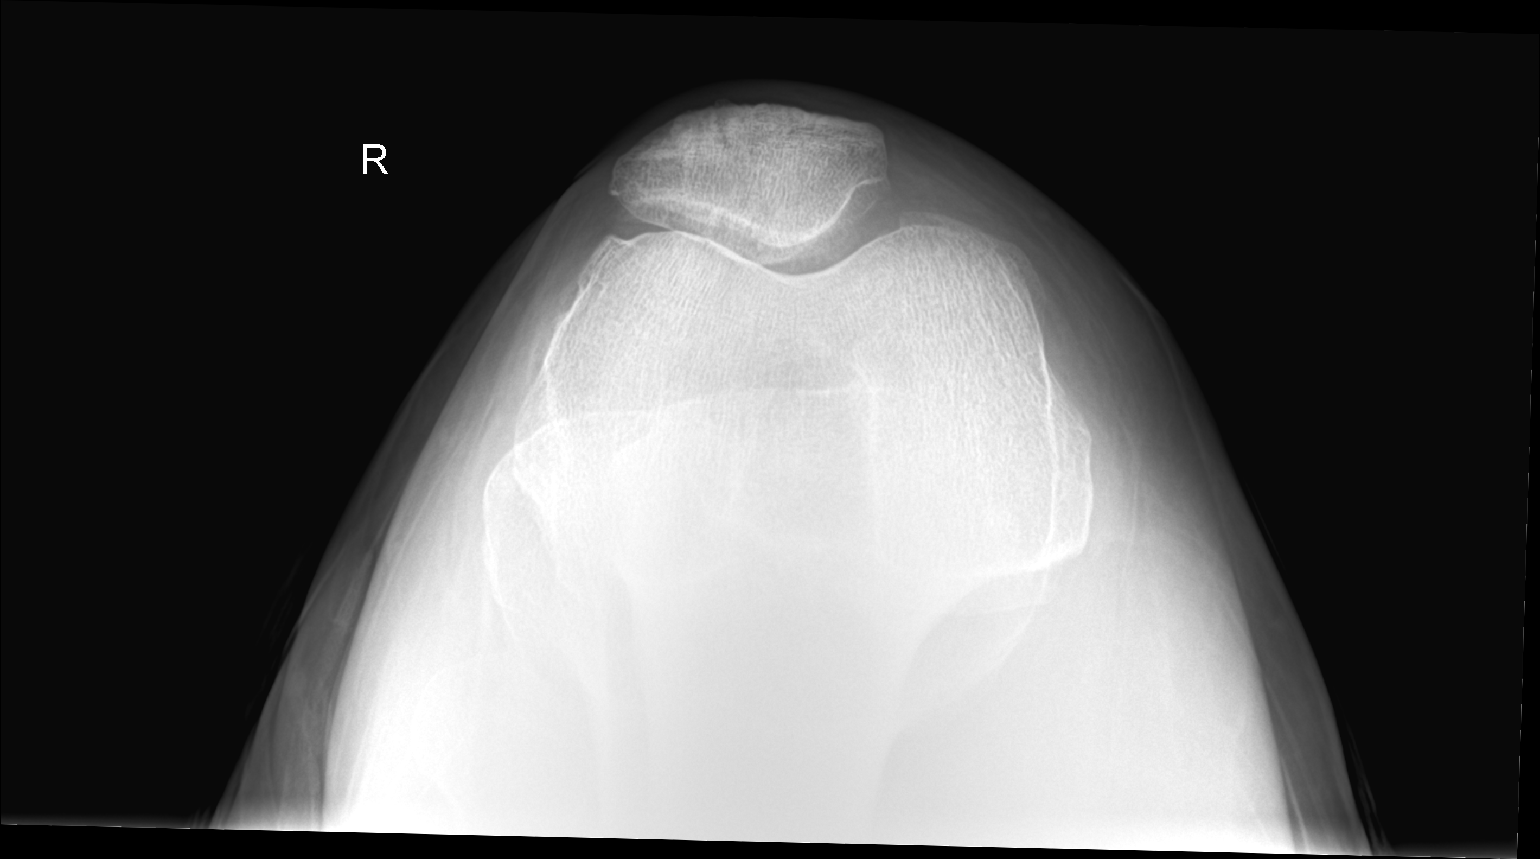

[knee lat]
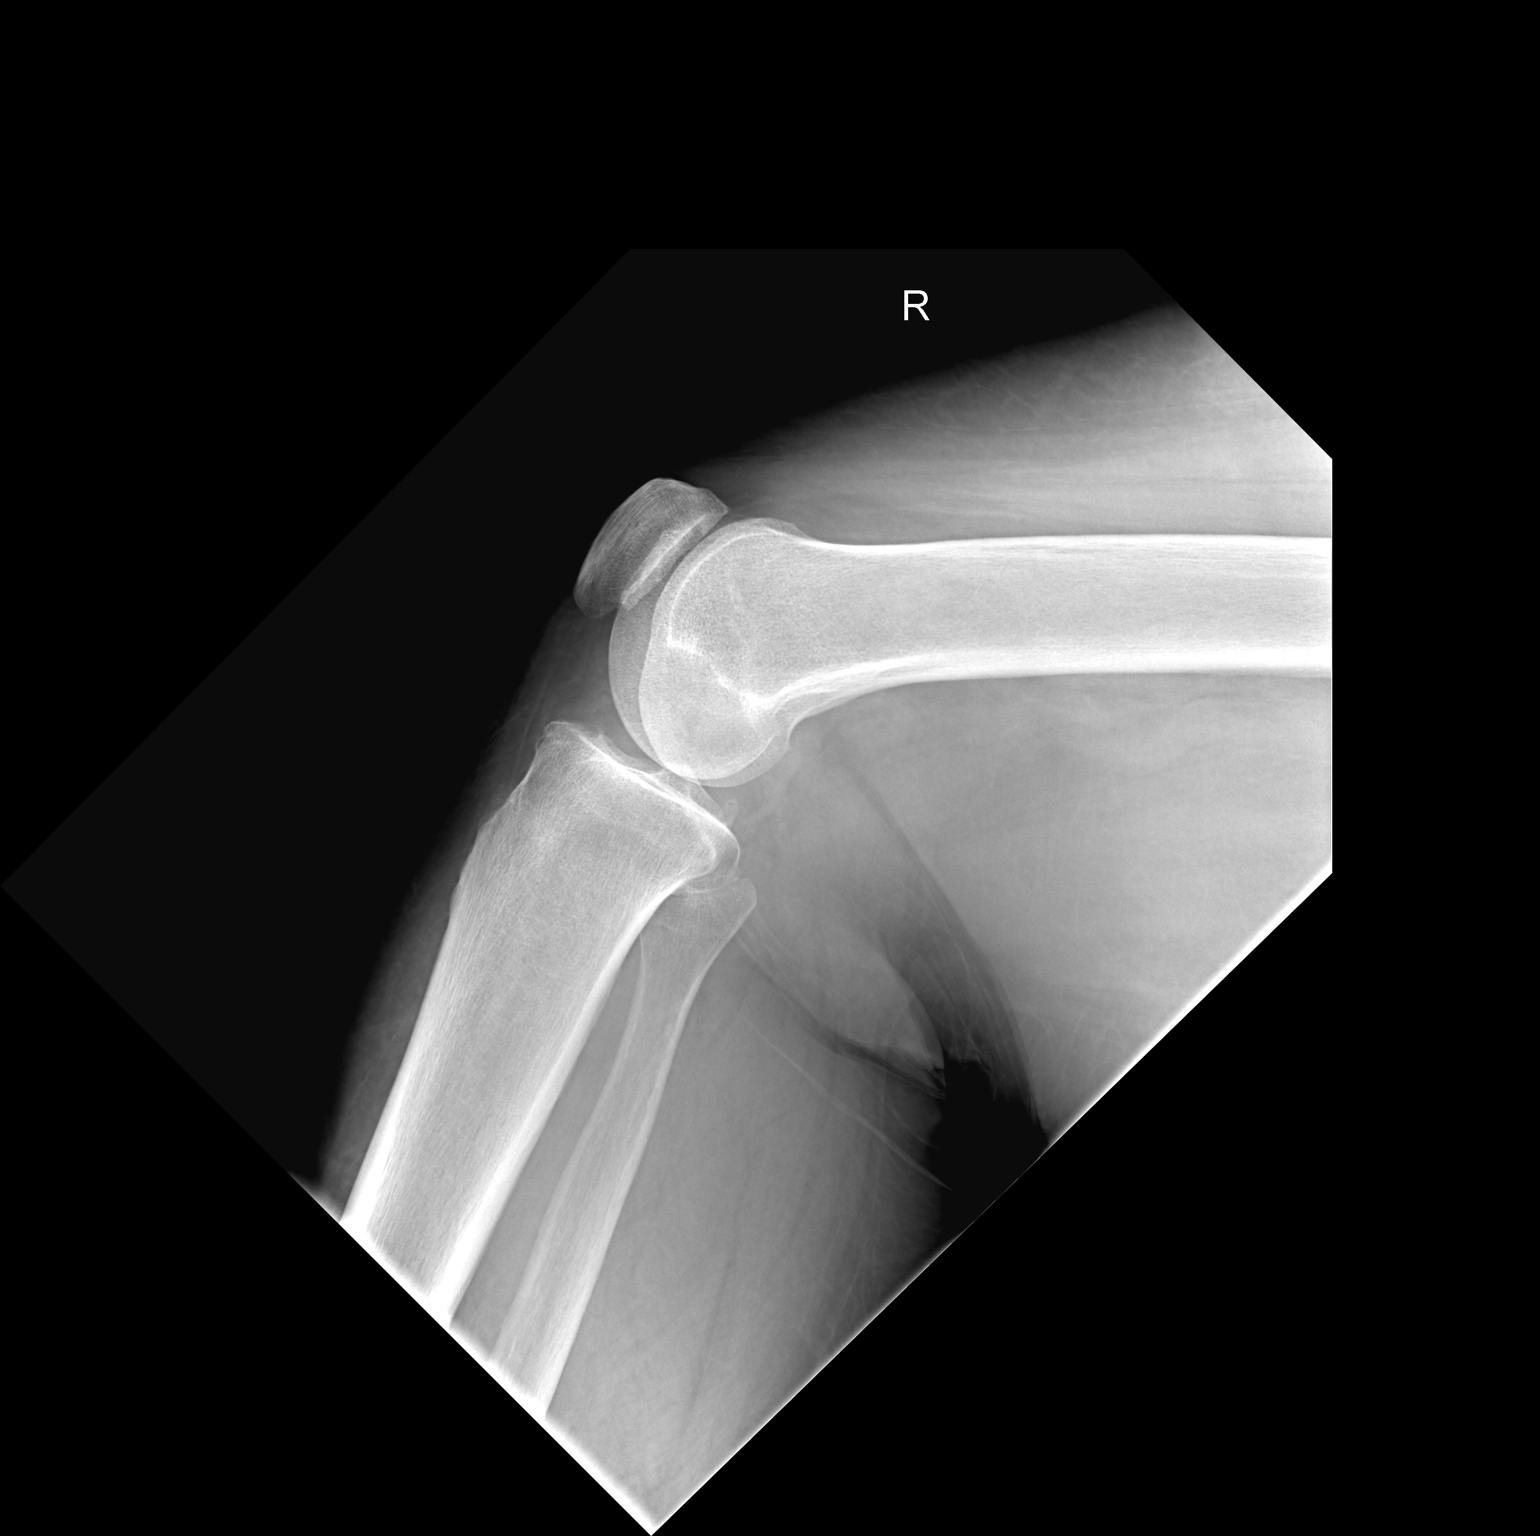

[3 of 3 positions shown; findings below may reference images not displayed]

DIAGNOSTIC STUDIES

EXAM

XR knee RT 3V

INDICATION

Knee pain
bilateral all over knee pain, no hx of surgery or trauma

TECHNIQUE

AP lateral patellar views

COMPARISONS

None available

FINDINGS

Slight medial compartment narrowing is noted with minimal spurring. There is mild spurring off the
articular facets of the patella. No fractures are seen.

IMPRESSION

Minor degenerative changes as described.

Tech Notes:

bilateral all over knee pain, no hx of surgery or trauma
